# Patient Record
Sex: Female | Born: 1975 | Race: White | Hispanic: No | Marital: Married | Smoking: Never smoker
Health system: Southern US, Community
[De-identification: ages and names within clinical notes are randomized; demographics above are authoritative.]

---

## 2014-11-11 ENCOUNTER — Encounter (HOSPITAL_COMMUNITY): Payer: Self-pay | Admitting: *Deleted

## 2014-11-11 ENCOUNTER — Emergency Department (INDEPENDENT_AMBULATORY_CARE_PROVIDER_SITE_OTHER): Payer: Managed Care, Other (non HMO)

## 2014-11-11 ENCOUNTER — Emergency Department (HOSPITAL_COMMUNITY): Payer: Managed Care, Other (non HMO)

## 2014-11-11 ENCOUNTER — Emergency Department (HOSPITAL_COMMUNITY)
Admission: EM | Admit: 2014-11-11 | Discharge: 2014-11-11 | Disposition: A | Discharge status: Home / Self Care | Payer: Managed Care, Other (non HMO) | Source: Home / Self Care | Attending: Family Medicine | Admitting: Family Medicine

## 2014-11-11 DIAGNOSIS — S93402A Sprain of unspecified ligament of left ankle, initial encounter: Secondary | ICD-10-CM

## 2014-11-11 MED ORDER — HYDROCODONE-ACETAMINOPHEN 5-325 MG PO TABS: 1.0000 | ORAL_TABLET | Freq: Four times a day (QID) | ORAL | Status: AC | PRN

## 2014-11-11 NOTE — ED Notes (Signed)
Pt    Reports     She  Twisted   Her  l  Ankle          X   2  Hours  Ago    She  Has  Pain  And   Swelling  To  The  Affected  Ankle            She  Has  The        Ankle  Wrapped  With  Tape

## 2014-11-11 NOTE — Discharge Instructions (Signed)
Wear ankle support as needed for comfort, activity as tolerated. Crutches and ice as needed, advil or pain medicine as needed, return or see orthopedist if further problems.

## 2014-11-11 NOTE — ED Provider Notes (Addendum)
CSN: [id number]     Arrival date & time 11/11/14  1433 History   None    Chief Complaint  Patient presents with  . Ankle Pain   (Consider location/radiation/quality/duration/timing/severity/associated sxs/prior Treatment) Patient is a 39 y.o. female presenting with ankle pain. The history is provided by the patient.  Ankle Pain Location:  Ankle Time since incident:  2 hours Injury: yes   Mechanism of injury: fall   Mechanism of injury comment:  Stepped and twisted left medial ankle. Fall:    Fall occurred:  Walking   Impact surface:  Stairs Ankle location:  L ankle Chronicity:  New Dislocation: no   Prior injury to area:  No Associated symptoms: decreased ROM     History reviewed. No pertinent past medical history. History reviewed. No pertinent past surgical history. History reviewed. No pertinent family history. History  Substance Use Topics  . Smoking status: Never Smoker   . Smokeless tobacco: Not on file  . Alcohol Use: No   OB History    No data available     Review of Systems  Constitutional: Negative.   Musculoskeletal: Positive for joint swelling and gait problem.  Skin: Negative.     Allergies  Review of patient's allergies indicates not on file.  Home Medications   Prior to Admission medications   Medication Sig Start Date End Date Taking? Authorizing Provider  BuPROPion HCl (WELLBUTRIN PO) Take by mouth.   Yes Historical Provider, MD  escitalopram (LEXAPRO) 20 MG tablet Take 20 mg by mouth daily.   Yes Historical Provider, MD  HYDROcodone-acetaminophen (NORCO/VICODIN) 5-325 MG per tablet Take 1 tablet by mouth every 6 (six) hours as needed. Prn pain 11/11/14   Linna HoffJames D Ellerie Arenz, MD   BP 106/75 mmHg  Pulse 73  Temp(Src) 98.8 F (37.1 C) (Oral)  Resp 16  SpO2 96% Physical Exam  Constitutional: He is oriented to person, place, and time. He appears well-developed and well-nourished.  Musculoskeletal: He exhibits tenderness.       Left ankle: He  exhibits decreased range of motion and swelling. He exhibits normal pulse. No lateral malleolus and no medial malleolus tenderness found. Achilles tendon exhibits no pain, no defect and normal Thompson's test results.       Feet:  Neurological: He is alert and oriented to person, place, and time.  Skin: Skin is warm and dry.  Nursing note and vitals reviewed.   ED Course  Procedures (including critical care time) Labs Review Labs Reviewed - No data to display  Imaging Review Dg Ankle Complete Left  11/11/2014   CLINICAL DATA:  Stepped off a stair wrong and rolled LEFT ankle outward, LEFT ankle pain medially, severe pain, unable to bear weight  EXAM: LEFT ANKLE COMPLETE - 3+ VIEW  COMPARISON:  None  FINDINGS: Osseous mineralization normal.  Ankle mortise intact.  Corticated ossicle at tip of medial malleolus.  Abnormal appearance of the anterior calcaneus on the lateral view, with superimposed density at the calcaneus and poor definition of the calcaneocuboid joint.  No additional focal bony abnormality seen.  IMPRESSION: Abnormal appearance of the anterior calcaneus on the lateral view, question superimposed ossicle though the calcaneocuboid joint is poorly visualized ; dedicated LEFT foot radiographs recommended to evaluate.   Electronically Signed   By: Ulyses SouthwardMark  Boles M.D.   On: 11/11/2014 15:37   Dg Foot Complete Left  11/11/2014   CLINICAL DATA:  Medial left ankle pain  EXAM: LEFT FOOT - COMPLETE 3+ VIEW  COMPARISON:  None.  FINDINGS: No fracture or dislocation is seen.  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  The joint spaces are preserved.  The visualized soft tissues are unremarkable.  IMPRESSION: No acute osseous abnormality is seen.   Electronically Signed   By: Charline Bills M.D.   On: 11/11/2014 16:08   X-rays reviewed and report per radiologist.   MDM   1. Ankle sprain, left, initial encounter        Linna Hoff,  MD 11/11/14 1519  Linna Hoff, MD 11/11/14 [phone]

## 2016-06-08 IMAGING — DX DG ANKLE COMPLETE 3+V*L*
3 series · 3 of 3 positions shown · non-contrast
Comparison: None

CLINICAL DATA: Stepped off a stair wrong and rolled LEFT ankle
outward, LEFT ankle pain medially, severe pain, unable to bear
weight

EXAM:
LEFT ANKLE COMPLETE - 3+ VIEW

[ankle ap]
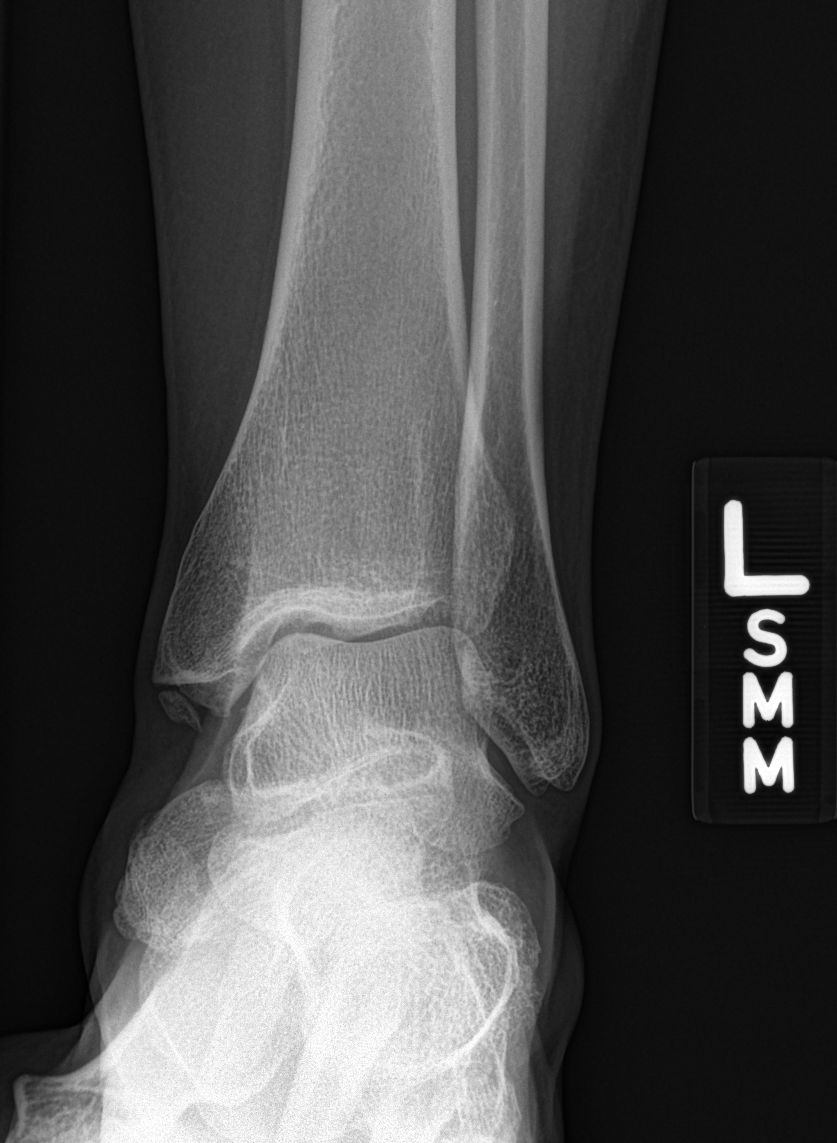

[ankle obl]
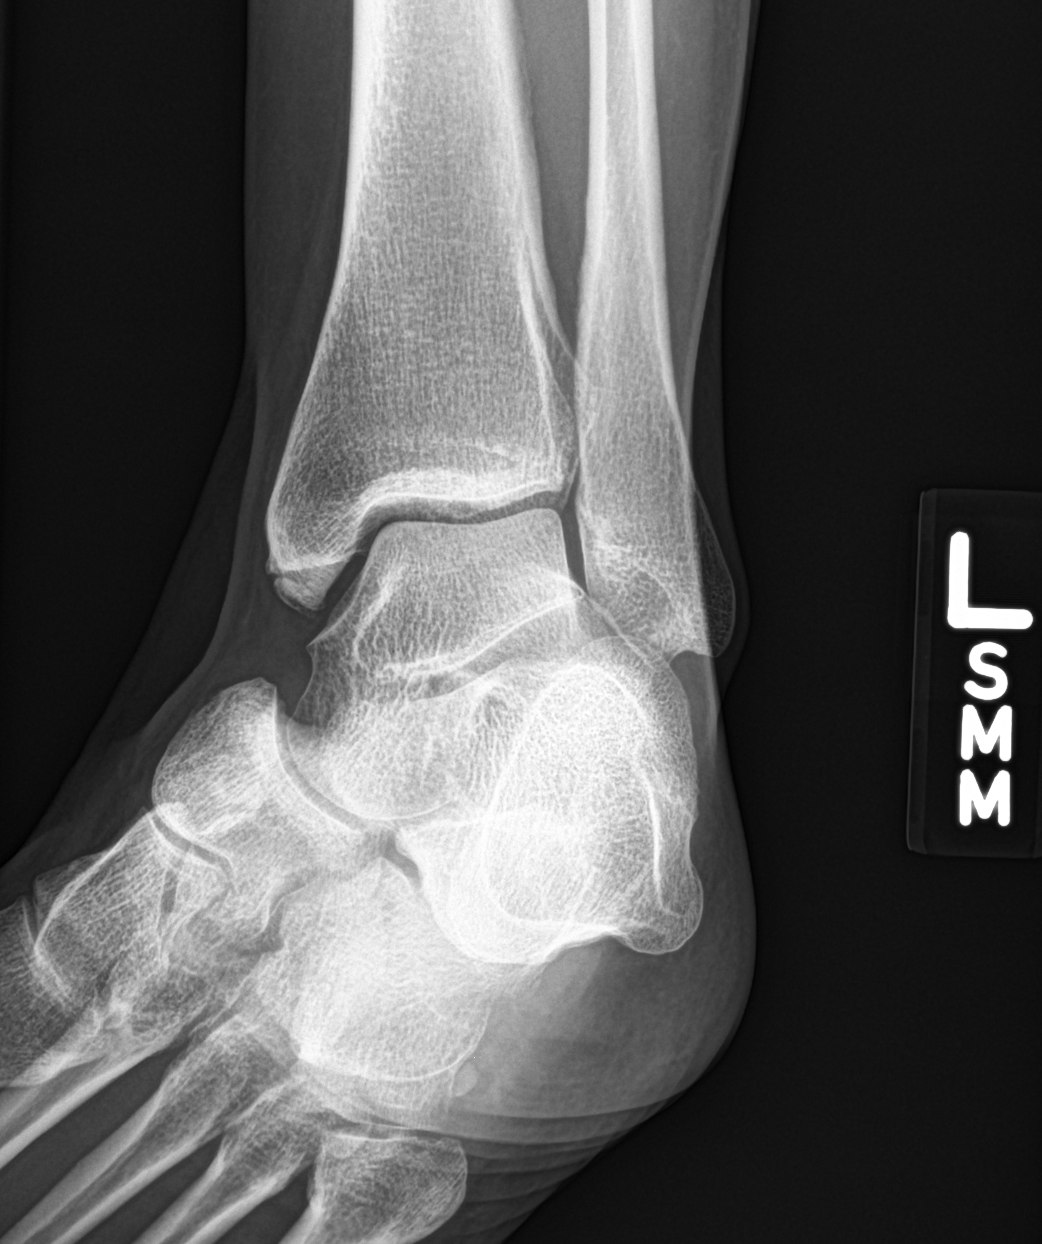

[ankle lat]
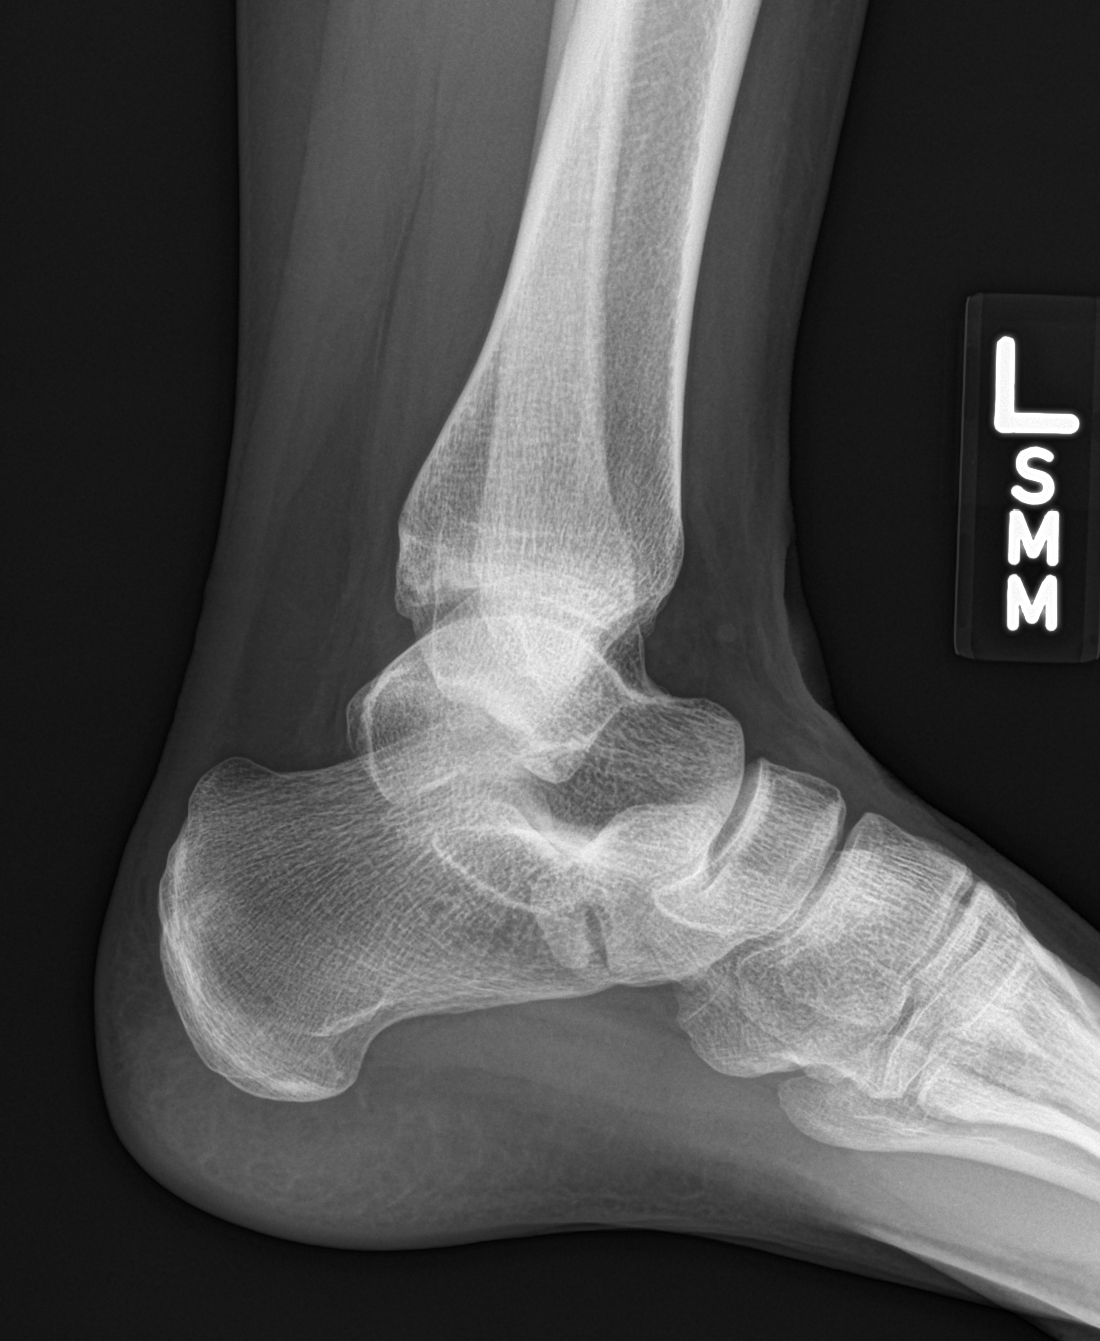

[3 of 3 positions shown; findings below may reference images not displayed]

FINDINGS: Osseous mineralization normal.

Ankle mortise intact.

Corticated ossicle at tip of medial malleolus.

Abnormal appearance of the anterior calcaneus on the lateral view,
with superimposed density at the calcaneus and poor definition of
the calcaneocuboid joint.

No additional focal bony abnormality seen.
IMPRESSION: Abnormal appearance of the anterior calcaneus on the lateral view,
question superimposed ossicle though the calcaneocuboid joint is
poorly visualized ; dedicated LEFT foot radiographs recommended to
evaluate.

## 2016-06-08 IMAGING — DX DG FOOT COMPLETE 3+V*L*
3 series · 3 of 3 positions shown · non-contrast
Comparison: None.

CLINICAL DATA: Medial left ankle pain

EXAM:
LEFT FOOT - COMPLETE 3+ VIEW

[foot ap]
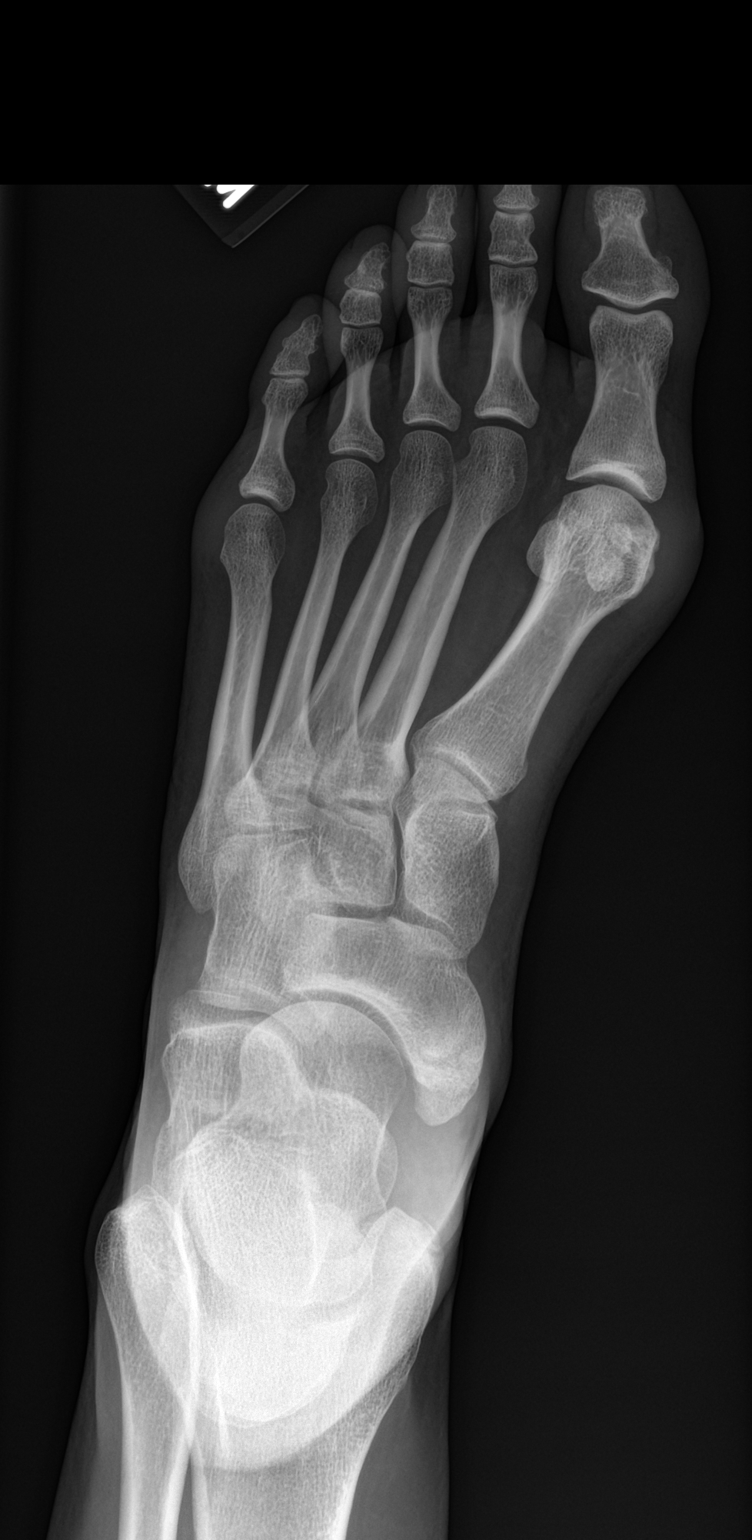

[foot obl]
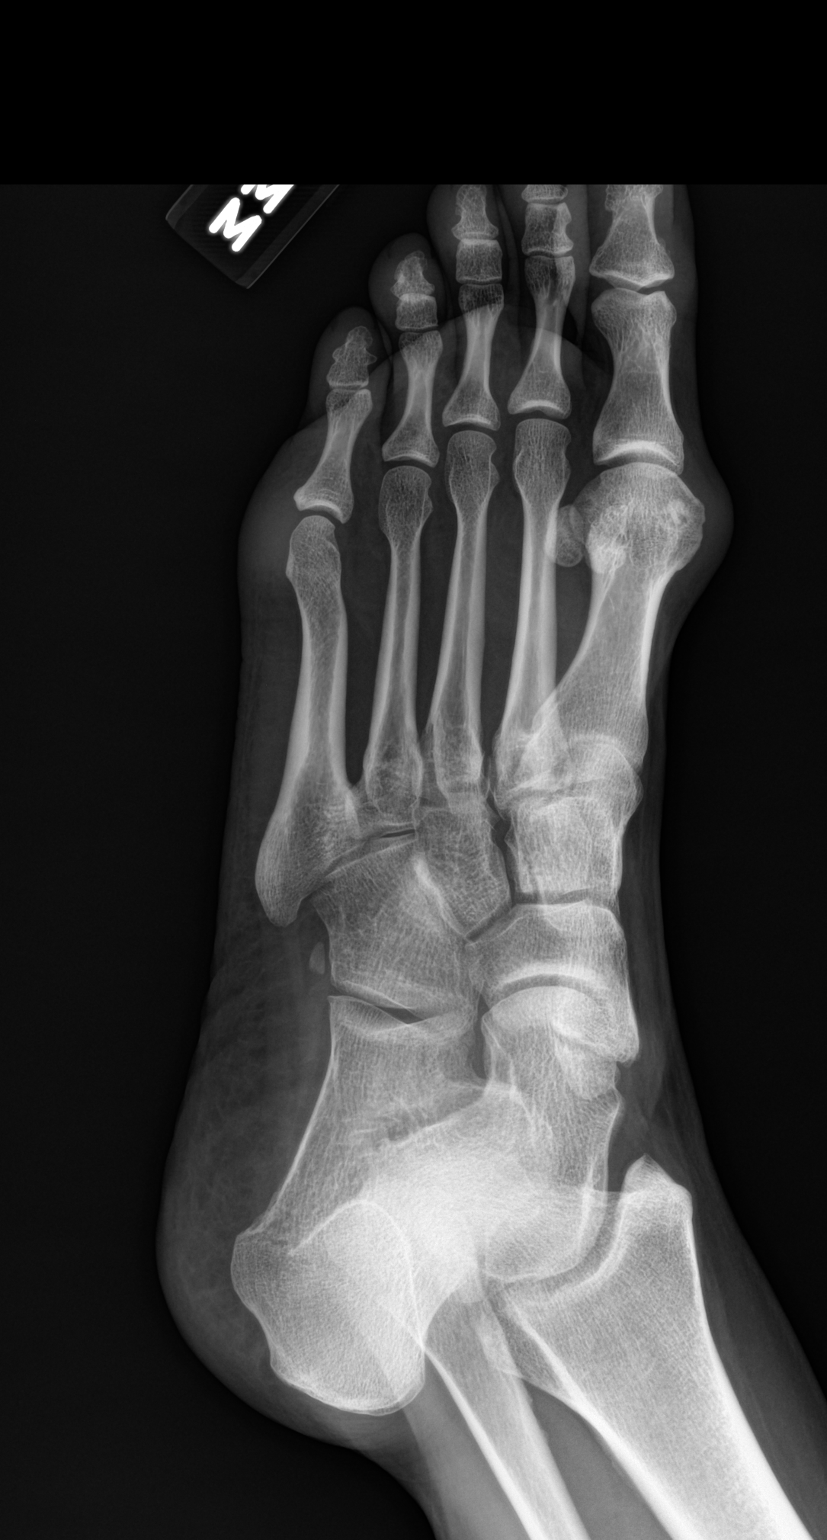

[foot lat]
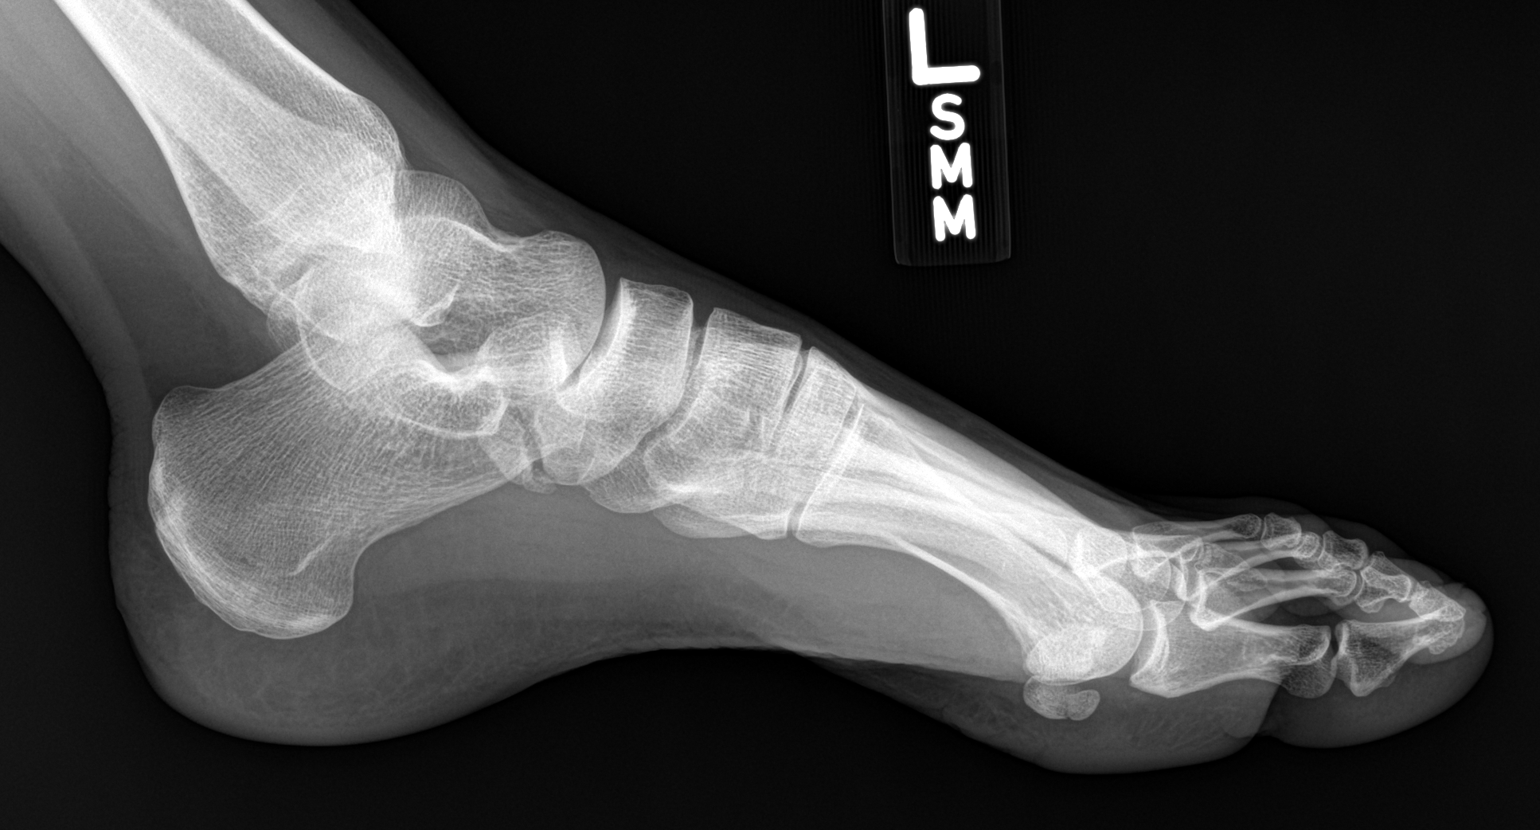

[3 of 3 positions shown; findings below may reference images not displayed]

FINDINGS: No fracture or dislocation is seen.

Radiographic abnormality corresponds to an accessory os navicularis,
well corticated.

The joint spaces are preserved.

The visualized soft tissues are unremarkable.
IMPRESSION: No acute osseous abnormality is seen.
